# Patient Record
Sex: Male | Born: 2000 | Race: White | Hispanic: No | Marital: Single
Health system: Midwestern US, Community
[De-identification: ages and names within clinical notes are randomized; demographics above are authoritative.]

## PROBLEM LIST (undated history)

## (undated) DIAGNOSIS — M25571 Pain in right ankle and joints of right foot: Secondary | ICD-10-CM

---
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
## 2019-03-23 ENCOUNTER — Ambulatory Visit: Payer: BC Managed Care – PPO | Attending: Internal Medicine

## 2019-03-23 DIAGNOSIS — Z20822 Contact with and (suspected) exposure to covid-19: Secondary | ICD-10-CM

## 2019-03-24 LAB — NOVEL CORONAVIRUS, NAA: SARS-CoV-2, NAA: NOT DETECTED

## 2019-07-22 ENCOUNTER — Ambulatory Visit: Payer: BC Managed Care – PPO | Attending: Internal Medicine

## 2019-07-22 DIAGNOSIS — Z23 Encounter for immunization: Secondary | ICD-10-CM

## 2019-07-22 NOTE — Progress Notes (Signed)
   Covid-19 Vaccination Clinic  Name:  Harry George    MRN: [id number] DOB: 2000-05-20  07/22/2019  [name] was observed post Covid-19 immunization for 15 minutes without incident. He was provided with Vaccine Information Sheet and instruction to access the V-Safe system.   [name] was instructed to call 911 with any severe reactions post vaccine: Marland Kitchen Difficulty breathing  . Swelling of face and throat  . A fast heartbeat  . A bad rash all over body  . Dizziness and weakness   Immunizations Administered    Name Date Dose VIS Date Route   Pfizer COVID-19 Vaccine 07/22/2019 11:30 AM 0.3 mL 05/12/2018 Intramuscular   Manufacturer: [employer], [employer]   Lot: [id number]   NDC: [address]-2

## 2019-08-17 ENCOUNTER — Ambulatory Visit: Payer: BC Managed Care – PPO | Attending: Internal Medicine

## 2019-08-17 DIAGNOSIS — Z23 Encounter for immunization: Secondary | ICD-10-CM

## 2019-08-17 NOTE — Progress Notes (Signed)
   Covid-19 Vaccination Clinic  Name:  Harry George    MRN: [id number] DOB: 07/17/2000  08/17/2019  [name] was observed post Covid-19 immunization for 15 minutes without incident. He was provided with Vaccine Information Sheet and instruction to access the V-Safe system.   [name] was instructed to call 911 with any severe reactions post vaccine: Marland Kitchen Difficulty breathing  . Swelling of face and throat  . A fast heartbeat  . A bad rash all over body  . Dizziness and weakness   Immunizations Administered    Name Date Dose VIS Date Route   Pfizer COVID-19 Vaccine 08/17/2019 10:05 AM 0.3 mL 05/12/2018 Intramuscular   Manufacturer: [employer], [employer]   Lot: [id number]   NDC: [address]-1

## 2019-09-14 ENCOUNTER — Other Ambulatory Visit: Payer: Self-pay

## 2019-09-14 ENCOUNTER — Ambulatory Visit: Payer: BC Managed Care – PPO | Admitting: Sports Medicine

## 2019-09-14 ENCOUNTER — Encounter: Payer: Self-pay | Admitting: Sports Medicine

## 2019-09-14 ENCOUNTER — Ambulatory Visit: Payer: Self-pay

## 2019-09-14 VITALS — BP 104/68 | Ht 72.0 in | Wt 145.0 lb

## 2019-09-14 DIAGNOSIS — M25561 Pain in right knee: Secondary | ICD-10-CM

## 2019-09-14 NOTE — Progress Notes (Signed)
   [facility] [address] 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  He took a few weeks off after the season to rest.  At the beginning of this month he started training again, pain-free.  He then had his wisdom teeth out so took another week off.  After that time, he started training again and noticed right anterior knee pain that was worse with hill running.  He denies any weakness, swelling, numbness, or tingling.  He ran about 30 miles last week.  He does not notice it when walking or sitting.  He is wearing custom orthotics.  He is planning to train in [place] for 1 month this summer.Worried about hill running and knee pain. Times: 31:12 10K and 15:04 for 5K     ROS:     See HPI.   PERTINENT  PMH / PSH / FH / SH:  Past Medical, Surgical, Social, and Family History Reviewed & Updated in the EMR. Pertinent findings include:  History of Sever's disease, left Achilles tendinitis, right peroneal tendinitis   OBJECTIVE:  BP 104/68   Ht 6' (1.829 m)   Wt 145 lb (65.8 kg)   BMI 19.67 kg/m  Physical Exam:  Vital signs are reviewed.   GEN: Alert and oriented, NAD Pulm: Breathing unlabored PSY: normal mood, congruent affect  MSK: Right knee: Normal to inspection with no erythema or effusion or obvious bony abnormalities. Palpation normal with no warmth, joint line tenderness, patellar tenderness, or condyle tenderness. ROM full in flexion and extension and lower leg rotation. Ligaments with solid consistent endpoints including ACL, PCL, LCL, MCL. Clicking with McMurray's Painful patellar compression. Patellar glide with crepitus. Patellar and  quadriceps tendons unremarkable. Hamstring and quadriceps strength is normal.  Glue medius strength is slightly weaker compared to left side Neurovascularly intact.  MSK right Limited knee ultrasound performed,  Suprapatellar pouch visualized in long and short axis with no abnormality. Quadriceps tendon visualized in both long and short axis without abnormality. Patellar Tendon is visualized in long and short axis with small amount of swelling deep to patella tendon origin Medial meniscus and MCL visualized with no abnormality Lateral mensicus and LCL visualized with no abnormality Medial sided trochlear view demonstrates small calcification on patella   IMPRESSION:  Small calcification at medial superior patella  Ultrasound performed and interpreted by Royal Hawthorn B. Fields, MD and Dr. Ruta Hinds  ASSESSMENT & PLAN:   1. Right knee pain  Given the ultrasound findings, we recommend avoiding downhill and sprint work at this time.  We have fitted him with a patella knee strap today that he can use.  We have also given him home exercises for hip abduction.  He should cross train this summer with a lot of cycling.  We will plan to see him back before he returns to college this fall. Ice after runs.  Judge Stall, DO, ATC Sports Medicine Fellow  I observed and examined the patient with Dr. Ruta Hinds and agree with assessment and plan.  Note reviewed and modified by me. Sterling Big, MD

## 2019-11-02 ENCOUNTER — Ambulatory Visit: Payer: BC Managed Care – PPO | Admitting: Sports Medicine

## 2019-11-02 ENCOUNTER — Other Ambulatory Visit: Payer: Self-pay

## 2019-11-02 DIAGNOSIS — M25561 Pain in right knee: Secondary | ICD-10-CM | POA: Diagnosis not present

## 2019-11-02 NOTE — Patient Instructions (Signed)
It was a pleasure to see you today!  To summarize our discussion for this visit:  To continue to improve your knee pain we are going to add a few new things to try.  Ice your knee after your runs  Apply Voltaren gel to the knee up to 3 times per day  Continue your hip abduction exercises  Add in VMO exercise that will be given to you to print out  Crosstraining on a bike or stationary bike at least twice per week  If you see any swelling in your knee, please stop running and come in to be evaluated   Thank you for allowing me to take part in your care,  Dr. Jamelle Rushing

## 2019-11-02 NOTE — Assessment & Plan Note (Signed)
   Ice knee after your runs  Apply Voltaren gel to the knee up to 3 times per day  Continue hip abduction exercises  Add in VMO exercise given in handout  Crosstraining on a bike or stationary bike at least twice per week  If any swelling in knee, stop running and come in to be evaluated

## 2019-11-02 NOTE — Progress Notes (Signed)
PCP: Estrella Myrtle, MD  Subjective:   CC: Patient is a 19 y.o. male here for f/u knee pain.  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  He was last seen 6/29 and had an ultrasound at that time showing small calcification at the medial superior patella as well as pain on exam with patellar grind and crepitus present. Plan at that visit was to avoid downhill and sprint work, use patellar knee strap when running, use home exercises for hip abduction and utilize crosstraining over the summer. Patient has been doing crosstraining without any increased pain.  He has been running about 55 miles per week including hills and is still having anterior right knee pain which is tolerable.  He has been using the knee strap and doing the exercises and had some improvement in his pain.  He does not have any pain at rest but will sometimes be exacerbated with long periods of standing.  Denies any swelling of the knee. His goal is to be able to run 75 miles per week with his training program at school.     Objective:  BP 112/68   Ht 6' (1.829 m)   Wt 145 lb (65.8 kg)   BMI 19.67 kg/m   Physical Exam: Gen: NAD, comfortable in exam room Knees  Observation: Negative for erythema, edema Palpation: Negative for joint line tenderness ROM: Full flexion extension of bilateral knees Strength: Full strength Sensation: Intact Special tests: Negative valgus, varus, McMurray's, patellar grind, Lachman's, drawer tests   Assessment & Plan:  Acute pain of right knee  Ice knee after your runs  Apply Voltaren gel to the knee up to 3 times per day  Continue hip abduction exercises  Add in VMO exercise given in handout  Crosstraining on a bike or stationary bike at least twice per week  If any swelling in knee, stop running and come in to be evaluated   I independently examined pertinent imaging in relation to cc.  Jamelle Rushing, DO [facility]  Health Family Medicine PGY-3  I observed and examined the patient with the resident and agree with assessment and plan.  Note reviewed and modified by me. Sterling Big, MD

## 2020-12-11 ENCOUNTER — Ambulatory Visit: Admit: 2020-12-11 | Payer: BLUE CROSS/BLUE SHIELD | Primary: Adult Health

## 2020-12-11 ENCOUNTER — Ambulatory Visit
Admit: 2020-12-11 | Discharge: 2020-12-11 | Payer: BLUE CROSS/BLUE SHIELD | Attending: Orthopaedic Surgery | Primary: Adult Health

## 2020-12-11 DIAGNOSIS — M79671 Pain in right foot: Secondary | ICD-10-CM

## 2020-12-11 MED ORDER — MELOXICAM 15 MG PO TABS
15 MG | ORAL_TABLET | Freq: Every day | ORAL | 0 refills | Status: AC
Start: 2020-12-11 — End: ?

## 2020-12-11 NOTE — Progress Notes (Signed)
Name: Jay Carlson  Date of Birth: 26-Oct-2000  Gender: male  MRN: [id number]    Summary:   Right sesamoiditis       CC: Foot Pain (Right foot pain will xray today )       HPI: Jay Carlson is a 20 y.o. male who presents with Foot Pain (Right foot pain will xray today )  .  This patient presents to the office today.  He is runs cross-country and track in college.  He stepped on a metal object at his house barefoot and had an onset of pain in the plantar aspect of his first MTP joint about 2 weeks ago.  Is been crosstraining since that time.    History was obtained by Patient     ROS/Meds/PSH/PMH/FH/SH: I personally reviewed the patients standard intake form.  Below are the pertinents    Tobacco:  has no history on file for tobacco use.  Diabetes: None      Physical Examination:  Exam of the right foot and ankle shows some tenderness palpation over the plantar aspect of the first MTP joint both sesamoids.  There is no evidence no evidence of turf toe injury.  He has palpable pulses and intact sensation.      Imaging:   I independently interpreted XR taken today  Right foot XR: AP, Lateral, Oblique views     ICD-10-CM    1. Right foot pain  M79.671 XR FOOT RIGHT (MIN 3 VIEWS)      2. Sesamoiditis of right foot  M25.871          Report: AP, lateral, oblique x-ray of the right foot demonstrates no sesamoid fracture or AVN    Impression: No sesamoid fracture or AVN   Eddie North III, MD           Assessment:   Right sesamoiditis    Treatment Plan:   4 This is a chronic illness/condition with exacerbation and progression  Treatment at this time: Prescription Drug Management  Studies ordered: NO XR needed @ Next Visit    Weight-bearing status: WBAT        Return to work/work restrictions: none  Mobic 15mg  p.o. qday x 14 days: An Rx was given. We discussed the use of Mobic.  I advised not to combine it with other NSAIDS such as advil, motrin, nor aleve.  I discussed Mobic and its affect on the GI system, its risk  of ulcer formation/exacerbation. I also discussed its affects on the kidneys and risk of nephritis and kidney damage.  We discussed how it can alter your blood coagulability and limit platelet function, its negative affect on coronary artery disease, and how excessive alcohol use with Mobic can make all these problems worse.     He will try some dancers pads in an effort to avoid custom orthotics and take anti-inflammatories.  We discussed an MRI if no better.  He is welcome to wean into running as tolerated.

## 2021-03-01 ENCOUNTER — Ambulatory Visit: Payer: BC Managed Care – PPO | Admitting: Sports Medicine

## 2021-03-01 ENCOUNTER — Ambulatory Visit: Payer: Self-pay

## 2021-03-01 VITALS — BP 114/68 | Ht 71.0 in | Wt 145.0 lb

## 2021-03-01 DIAGNOSIS — M7661 Achilles tendinitis, right leg: Secondary | ICD-10-CM | POA: Diagnosis not present

## 2021-03-01 NOTE — Progress Notes (Signed)
° °  Subjective:    Patient ID: Harry George, male    DOB: 2000-07-11, 20 y.o.   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  Pain began on November 29.  He had recently returned from an injury to his right soleus and sesamoiditis of the right foot.  He began to experience pain on the 29th after approximately a 3 mile run.  Pain is localized to the mid Achilles area.  He has not noticed any swelling.  He stopped running on December 2 and his pain has improved.  He was previously getting pain with walking but that has resolved.  He does think he had a previous Achilles issue in high school but none recently.  He has been getting treatment in the training room at his [employer] including ultrasound treatment, topical Voltaren, [place], and ice.  He does have some green sports insoles with padding in his running shoes.  He is here today with his father.  Interim medical history reviewed Medications reviewed Allergies reviewed    Review of Systems As above    Objective:   Physical Exam  Well-developed, fit appearing.  No acute distress.  Awake alert and oriented x3.  Vital signs reviewed  Right Achilles: No soft tissue swelling.  No palpable thickening.  He is tender to palpation in the anterior aspect of the tendon.  Good strength.  Good pulses.  No limping.  MSK ultrasound of the right Achilles:  Images compared to the left Achilles tendon.  Right Achilles tendon is thickened in the long axis view compared to the left.  Although there is also a small amount of fluid in the retrocalcaneal bursa on the right, this is similar to what is seen on the left.      Assessment & Plan:   Right Achilles tendinitis  Alfredson heel drop exercises.  Over-the-counter Aleve twice daily for 5 to 7 days.  He will wait approximately one  week before resuming running.   He may then start to increase his running using pain as his guide.  Harry George will return to college on January 4 so we will schedule a tentative follow-up with me for January 3 which she may feel free to cancel if he is pain-free.  This note was dictated using Dragon naturally speaking software and may contain errors in syntax, spelling, or content which have not been identified prior to signing this note.

## 2021-03-20 ENCOUNTER — Ambulatory Visit: Payer: Self-pay | Admitting: Sports Medicine

## 2021-03-21 ENCOUNTER — Ambulatory Visit
Admission: RE | Admit: 2021-03-21 | Discharge: 2021-03-21 | Disposition: A | Discharge status: Home / Self Care | Payer: BC Managed Care – PPO | Source: Ambulatory Visit | Attending: Sports Medicine | Admitting: Sports Medicine

## 2021-03-21 ENCOUNTER — Ambulatory Visit (INDEPENDENT_AMBULATORY_CARE_PROVIDER_SITE_OTHER): Payer: BC Managed Care – PPO | Admitting: Sports Medicine

## 2021-03-21 VITALS — BP 104/60 | Ht 72.0 in | Wt 150.0 lb

## 2021-03-21 DIAGNOSIS — M25571 Pain in right ankle and joints of right foot: Secondary | ICD-10-CM

## 2021-03-21 DIAGNOSIS — M7661 Achilles tendinitis, right leg: Secondary | ICD-10-CM

## 2021-03-21 MED ORDER — NAPROXEN 500 MG PO TABS: 500.0000 mg | ORAL_TABLET | Freq: Two times a day (BID) | ORAL | 0 refills | Status: AC

## 2021-03-21 NOTE — Progress Notes (Addendum)
° °  Subjective:    Patient ID: Harry George, male    DOB: [birth year], 21 y.o.   MRN: [id number]  HPI  Harry George presents today with persistent right ankle pain.  He was last seen in the office on December 15 and they diagnosis of Achilles tendinitis was made.  He was given Alfredson heel drop exercises and instructed to take over-the-counter Aleve.  However, when he returns to running, he has pain that he localizes to the medial ankle.  It is just anterior to the Achilles tendon.  He feels a "point of pressure "in addition to his pain.  He has not noticed any swelling or crepitus in the area.  He is leaving today to return to [place].  His green orthotics are in good shape.    Review of Systems As above    Objective:   Physical Exam  Well-developed, well-nourished.  No acute distress  Right ankle: Full range of motion.  No effusion.  No soft tissue swelling.  There is no tenderness to palpation over the medial malleolus and no tenderness over the Achilles tendon or posterior tibialis tendon.  There is some reproducible pain with resisted great toe flexion.  No crepitus.  Good pulses.  Evaluation of his running gait shows excellent form and no limping.  Neutral foot strike with green orthotics in place.  MSK ultrasound of the right ankle with attention to the medial ankle does show a small amount of fluid around the flexor hallucis  longus tendon.  Also some mild swelling along the flexor digitorum.  Posterior tibialis tendon is unremarkable.      Assessment & Plan:   Persistent medial right ankle pain possibly secondary to flexor hallucis longus tendinitis  I would like to put been on a prescription strength of naproxen sodium.  We will put him on 500 mg twice daily with food for 7 days.  He may then take it as needed.  I had originally planned on putting him on meloxicam but he has an allergy to ketoprofen.  He does tolerate over-the-counter Aleve however.  Upon his return to school he will  follow-up with the athletic trainers for treatment.  I think he should rest and cross train for another week or so.  I am going to check an x-ray of the ankle just to rule out any obvious stress injury.  However, if he continues to have pain with running upon his return to school then I would recommend further imaging in the form of an MRI.  This note was dictated using Dragon naturally speaking software and may contain errors in syntax, spelling, or content which have not been identified prior to signing this note.   Addendum: X-rays are unremarkable.

## 2021-03-21 NOTE — Patient Instructions (Signed)
At this point, I do not think it is your Achilles tendon that is bothering you. You have a small amount of fluid around your flexor hallucis longus on the ultrasound and you have pain when I resist great toe flexion.  This all suggests that you have some inflammation in your flexor hallucis longus tendon.  There are not a lot of great exercises for this.  It is simply treated with rest.  I am going to prescribe meloxicam 15 mg you to take 1 pill daily for 7 days.  I will give you some extra pills to take as needed.  I think you should rest and cross train for another week or so until you can see the athletic trainers at your school for more treatment.  I am going to also check an x-ray of your ankle today.  Although I am not overly suspicious of a stress fracture, I want to check the x-ray just as a precaution.  Also, if you return to school and continue to have symptoms despite my recommendations today then I would recommend further evaluation with an MRI.  Good luck with school and let me know if you need anything from me!

## 2021-03-29 ENCOUNTER — Ambulatory Visit: Payer: BC Managed Care – PPO | Admitting: Sports Medicine

## 2021-04-04 ENCOUNTER — Encounter

## 2021-04-04 ENCOUNTER — Ambulatory Visit: Admit: 2021-04-04 | Discharge: 2021-04-04 | Payer: BLUE CROSS/BLUE SHIELD | Primary: Adult Health

## 2021-04-04 ENCOUNTER — Ambulatory Visit
Admit: 2021-04-04 | Discharge: 2021-04-04 | Payer: BLUE CROSS/BLUE SHIELD | Attending: Orthopaedic Surgery | Primary: Adult Health

## 2021-04-04 NOTE — Progress Notes (Signed)
Name: Jay Carlson  Date of Birth: 10/28/2000  Gender: male  MRN: [id number]    Summary:     Right FHL tenosynovitis versus tarsal tunnel syndrome     CC: Follow-up [employer]*  Right ankle  x-rays taken in office today)       HPI: Jay Carlson is a 21 y.o. male who presents with Follow-up [employer]*  Right ankle  x-rays taken in office today)  .  This patient presents the office today with ongoing pain located the medial aspect of his right foot.  It only happens with running but not with any regular activities.  Is been and had a period of rest for about 4 weeks.  Is been in physical therapy and is also tried anti-inflammatories without relief.    History was obtained by Patient     ROS/Meds/PSH/PMH/FH/SH: I personally reviewed the patients standard intake form.  Below are the pertinents    Tobacco:  reports that he has never smoked. He has never used smokeless tobacco.  Diabetes: None      Physical Examination:  Exam of the right foot and ankle does show some tenderness over the FHL and the posterior ankle with most of his pain is actually over the tarsal tunnel.  There is a positive Tinel's in this area.  There is no evidence of muscle atrophy noted.  There is no evidence of plantar fasciitis or plantar fascial insufficiency.  His Achilles is also nontender.      Imaging:   I independently interpreted XR taken today  Right ankle XR: AP, Lateral, Oblique views     ICD-10-CM    1. Acute right ankle pain  M25.571 XR ANKLE RIGHT (MIN 3 VIEWS)      2. 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  I recommended an MRI of the right ankle to evaluate for an occult stress fracture as well as for space-occupying mass in the tarsal tunnel.  We discussed potential treatment based on the result.  He will continue with rest until the MRI results are available.

## 2021-04-11 ENCOUNTER — Ambulatory Visit: Payer: BLUE CROSS/BLUE SHIELD | Primary: Adult Health

## 2021-04-11 ENCOUNTER — Inpatient Hospital Stay: Admit: 2021-04-11 | Payer: BLUE CROSS/BLUE SHIELD | Primary: Adult Health

## 2021-04-11 DIAGNOSIS — M25571 Pain in right ankle and joints of right foot: Secondary | ICD-10-CM

## 2021-04-12 NOTE — Progress Notes (Signed)
Notified patient per Dr. Cephus Shelling his MRI showed no stress fracture and mild tendinitis in the back of his ankle. Patient voiced understanding.

## 2021-06-08 ENCOUNTER — Ambulatory Visit: Payer: BC Managed Care – PPO | Admitting: Sports Medicine

## 2021-06-08 DIAGNOSIS — M25571 Pain in right ankle and joints of right foot: Secondary | ICD-10-CM | POA: Diagnosis not present

## 2021-06-08 DIAGNOSIS — R269 Unspecified abnormalities of gait and mobility: Secondary | ICD-10-CM | POA: Diagnosis not present

## 2021-06-08 MED ORDER — NITROGLYCERIN 0.2 MG/HR TD PT24: MEDICATED_PATCH | TRANSDERMAL | 1 refills | Status: AC

## 2021-06-08 NOTE — Patient Instructions (Addendum)
-  Please ice after you run for about 15 minutes. ?-Start nitro patches, see protocol below. ?-Use the inserts we gave you over the next month. ?-You can push yourself with running as tolerated, but probably best to avoid really high mileage as you haven't been at that level in awhile. ? ? ?Nitroglycerin Protocol ? ?Apply 1/4 nitroglycerin patch to affected area daily. ?Change position of patch within the affected area every 24 hours. ?You may experience a headache during the first 1-2 weeks of using the patch, these should subside. ?If you experience headaches after beginning nitroglycerin patch treatment, you may take your preferred over the counter pain reliever. ?Another side effect of the nitroglycerin patch is skin irritation or rash related to patch adhesive. ?Please notify our office if you develop more severe headaches or rash, and stop the patch. ?Tendon healing with nitroglycerin patch may require 12 to 24 weeks depending on the extent of injury. ?Men should not use if taking Viagra, Cialis, or Levitra.  ?Do not use if you have migraines or rosacea.   ?

## 2021-06-08 NOTE — Progress Notes (Signed)
Chief complaint pain at the Achilles and behind the right medial ankle ? ?Patient has been evaluated since December when he had a series of problems with his right leg ?He was training hard at the time ?He runs for for [employer] ? ?He has since been treated with icing ?Exercise including heel lift ?He has had a variety of diagnoses including Achilles tendinitis posterior tibial tendinitis flexor hallucis tendinitis and tarsal tunnel ?None has been completely confirmed ? ?He thinks this started after he had some sesamoid pain that may have altered his gait ? ?In any case he has been afraid to restart her training because of persistent sharp pains that periodically occur behind his right medial malleolus ? ?Over the past few weeks he has been back up to about 30 to 35 miles ?This is on soft surface and sometimes treadmill ?He still has mild pain but not enough to limit him from running or making limp ? ?Physical exam ?Athletic white male in no acute distress ?BP (!) 106/55   Ht 5\' 11"  (1.803 m)   Wt 150 lb (68 kg)   BMI 20.92 kg/m?  ?Ankle: ?No visible erythema or swelling. ?Range of motion is full in all directions. ?Strength is 5/5 in all directions. ?Stable lateral and medial ligaments; squeeze test and kleiger test unremarkable; ?Talar dome nontender; ?No pain at base of 5th MT; No tenderness over cuboid; ?No tenderness over N spot or navicular prominence ?No tenderness on posterior aspects of lateral and medial malleolus ?No sign of peroneal tendon subluxations; ?Negative tarsal tunnel tinel's ?Able to walk 4 steps. ? ?He really has no tenderness to palpation today ? ?Running gait shows excellent form without any sign of limp ? ?Ultrasound exam of right ankle medial ?Posterior tibialis tendon looks normal ?Flexor digitorum brevis tendon normal ?Flexor hallucis tendon normal ?Achilles tendon is slightly thick at 0.63 cm ?No abnormal Doppler activity ?No significant swelling ?No bony changes ?Tibial nerve looks  normal caliber ? ?Impression: Unremarkable ultrasound of right ankle ? ?Ultrasound and interpretation by . Juwan Vences, MD ? ? ?

## 2021-06-08 NOTE — Assessment & Plan Note (Signed)
I suspect he is recovering from an Achilles tendinitis but still has some hypersensitivity in the area ?With an unremarkable ultrasound today I think he can resume running ?He can gradually increase his intensity including bracing as long as he has no limp or significant swelling ?We did redo a pair of sports insoles with a large scaphoid pad to give him more support under the medial arch ?He will try topical nitroglycerin to the area to see if this benefits his symptoms ?He is to stop it if he has any migraines ?He can also use topical Voltaren and topical Arnica gel ?Recheck when he is back from school ?

## 2021-10-01 ENCOUNTER — Other Ambulatory Visit: Payer: Self-pay

## 2021-10-01 MED ORDER — PREDNISONE 10 MG (21) PO TBPK: ORAL_TABLET | Freq: Every day | ORAL | 0 refills | Status: AC

## 2021-11-01 ENCOUNTER — Ambulatory Visit: Payer: BC Managed Care – PPO | Admitting: Sports Medicine

## 2021-11-01 VITALS — BP 108/71 | Ht 71.0 in | Wt 150.0 lb

## 2021-11-01 DIAGNOSIS — R1031 Right lower quadrant pain: Secondary | ICD-10-CM | POA: Diagnosis not present

## 2021-11-01 NOTE — Progress Notes (Signed)
  Harry George - 21 y.o. male MRN [id number]  Date of birth: May 12, 2000    CHIEF COMPLAINT:   R groin pain    SUBJECTIVE:   HPI: Patient was clinic today for evaluated for right groin pain.  This pain has been present for about 2 days.  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  About 2 days ago after running he noticed a painful bulge in his right groin while in the shower.  He is unsure how long its been there.  He has never noticed this before.  He denies any acute injury such as heavy lifting.  He is unsure if it gets bigger with coughing or straining or changing positions. He denies any recent illness, fevers, chills, abdominal pain.   ROS:     See HPI  PERTINENT  PMH / PSH FH / / SH:  Past Medical, Surgical, Social, and Family History Reviewed & Updated in the EMR.  Pertinent findings include:  none  OBJECTIVE: BP 108/71   Ht 5\' 11"  (1.803 m)   Wt 150 lb (68 kg)   BMI 20.92 kg/m   Physical Exam:  Vital signs are reviewed.  GEN: Alert and oriented, NAD Pulm: Breathing unlabored PSY: normal mood, congruent affect  MSK: R groin -no obvious deformity seen in the groin but there is a several centimeter palpable bulge near the right inguinal fold. Positive bulge impulse palpated in the right inguinal canal with coughing.  No bulge impulse palpated in left inguinal canal with coughing.  Negative sit up test.  ASSESSMENT & PLAN:  1. R Groin Pain -History and exam most consistent with inguinal hernia.  We discussed that he needs to see a general surgeon to confirm the diagnosis and discuss surgical options if desired.  He was given a general surgery referral today.  He can continue running as tolerated in the meantime.  All questions were answered.  , MD PGY-4, Sports Medicine Fellow [facility]  Patient seen and evaluated with the sports medicine fellow.  I agree with the above plan of care.  Referral to  the general surgery for possible inguinal hernia.  Follow-up with [facility] as needed.

## 2021-11-01 NOTE — Progress Notes (Signed)
Surgical consultation scheduled

## 2021-11-01 NOTE — Patient Instructions (Addendum)
[facility] Darden Dates, September 5th @ 230p Arrival time is 2p [address], [place], [place] [address] Phone: [phone]

## 2022-10-04 IMAGING — DX DG ANKLE COMPLETE 3+V*R*
3 series · 3 of 3 positions shown · non-contrast
Comparison: None.

CLINICAL DATA: Right Achilles and ankle pain.

EXAM:
RIGHT ANKLE - COMPLETE 3+ VIEW

[dg ankle complete right (1 of 3)]
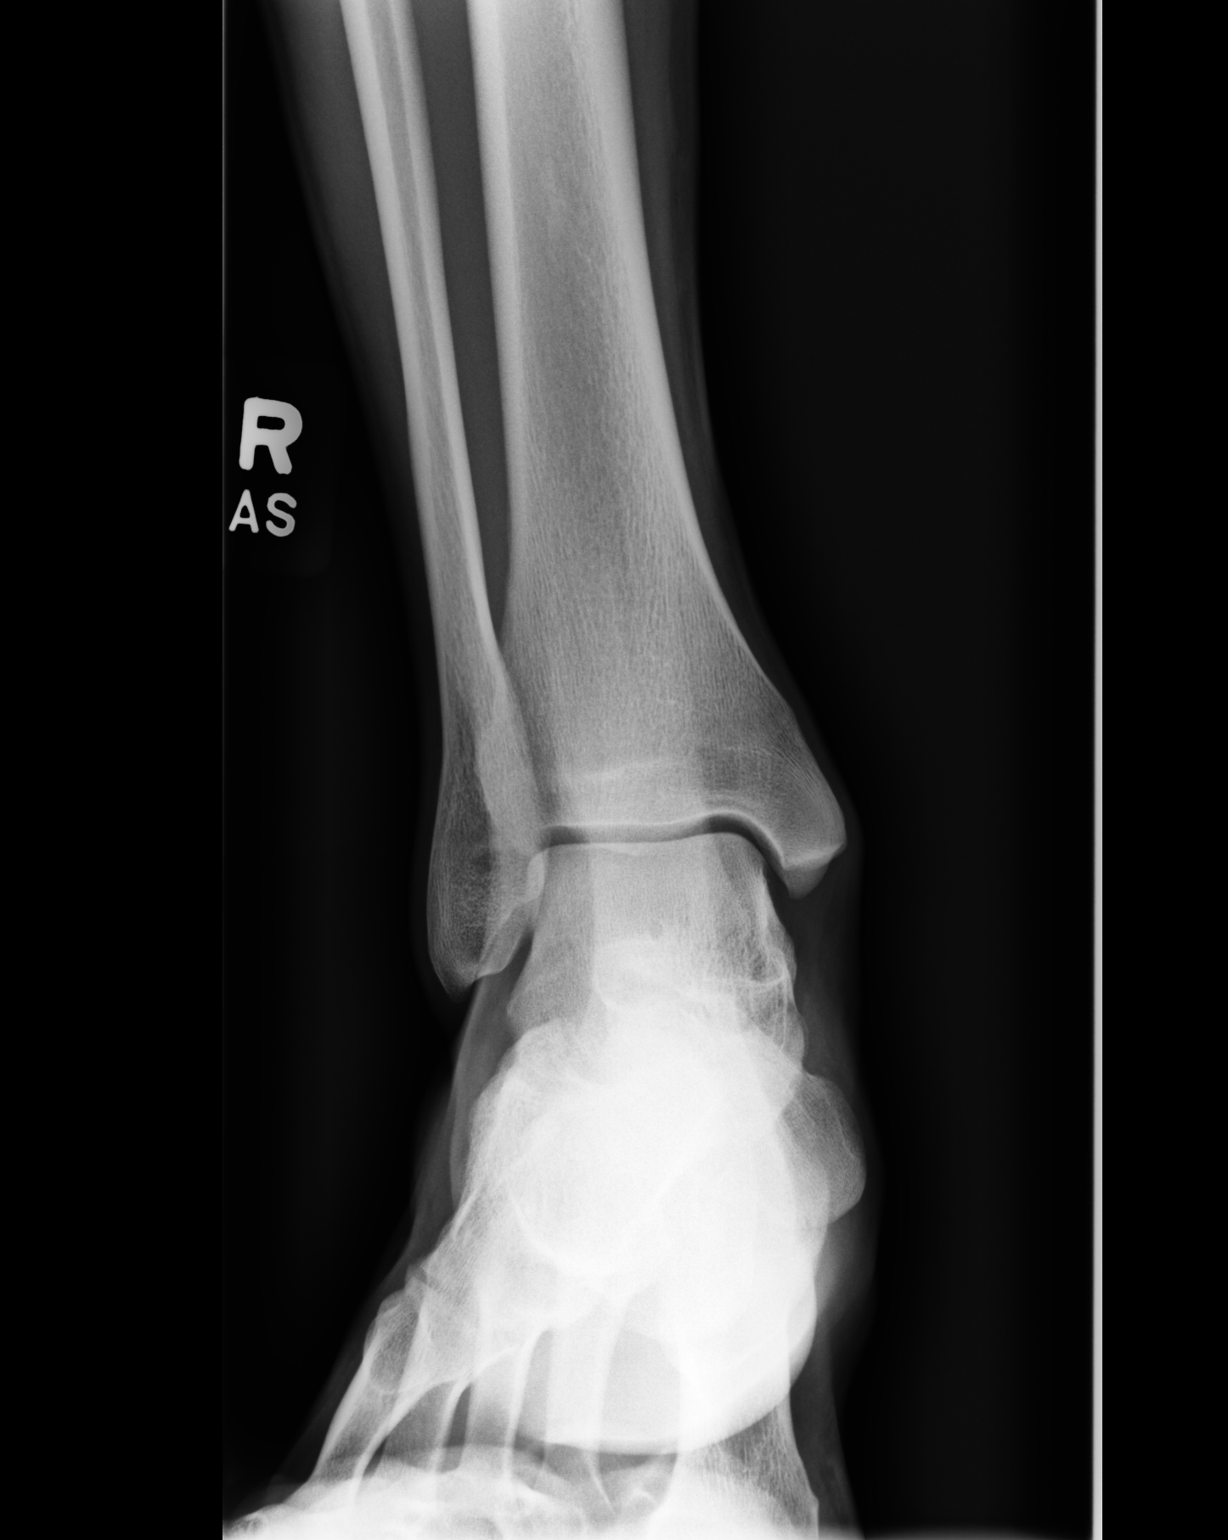

[dg ankle complete right (2 of 3)]
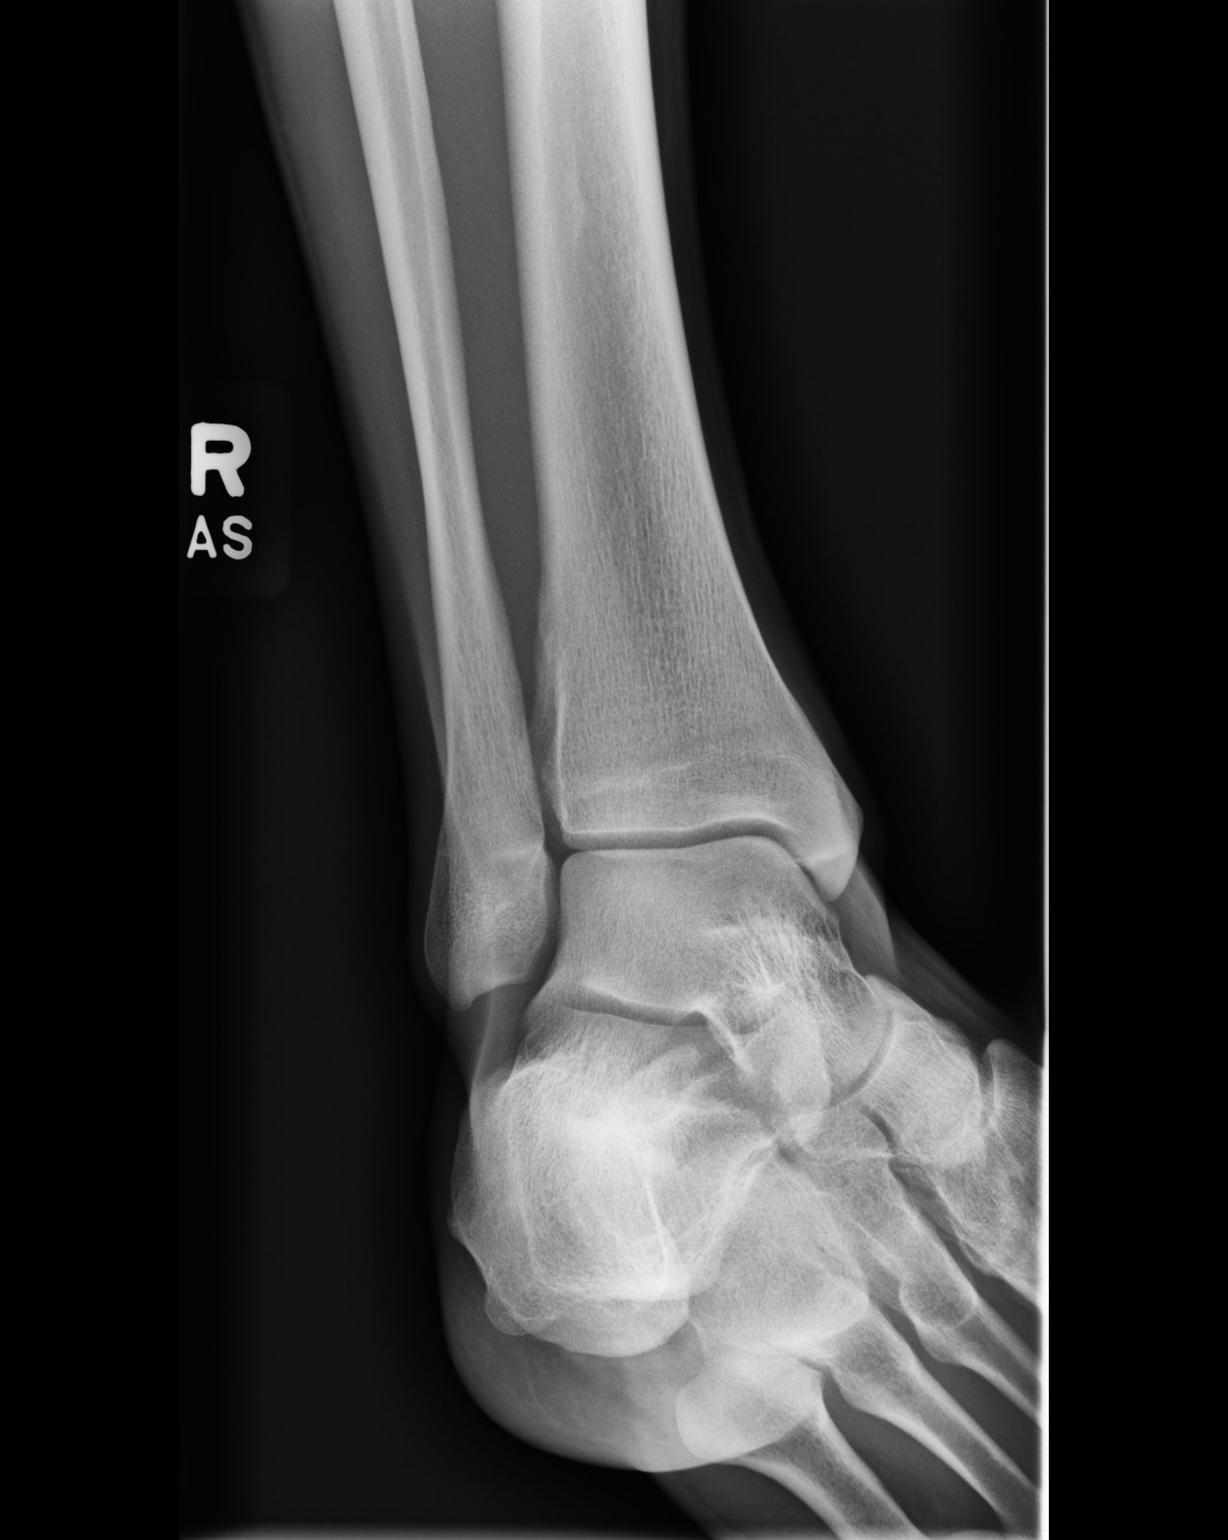

[dg ankle complete right (3 of 3)]
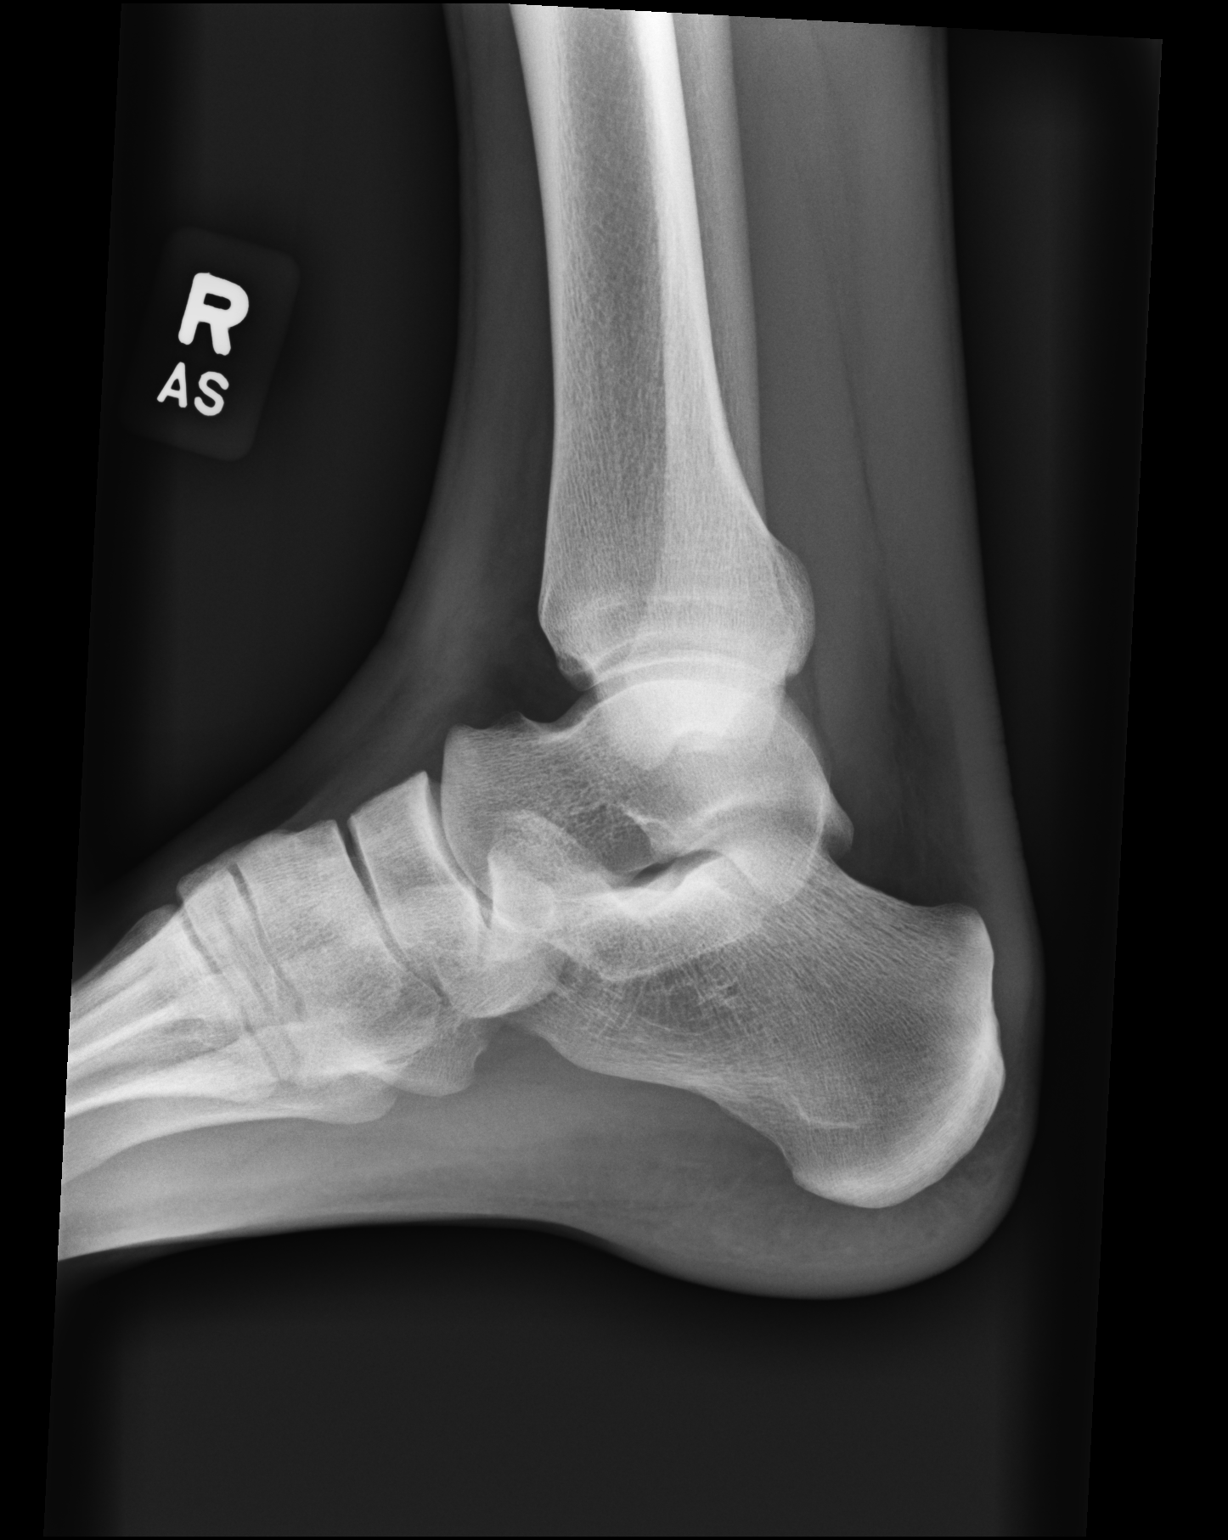

[3 of 3 positions shown; findings below may reference images not displayed]

FINDINGS: There is no evidence of fracture, dislocation, or joint effusion.
There is no evidence of arthropathy or other focal bone abnormality.
Soft tissues are unremarkable.
IMPRESSION: Negative.
